# Patient Record
Sex: Male | Born: 1995 | Race: White | Hispanic: No | Marital: Single | Smoking: Never smoker
Health system: Southern US, Community
[De-identification: ages and names within clinical notes are randomized; demographics above are authoritative.]

## PROBLEM LIST (undated history)

## (undated) HISTORY — PX: MOUTH SURGERY: SHX715

---
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
## 2020-11-12 ENCOUNTER — Emergency Department (INDEPENDENT_AMBULATORY_CARE_PROVIDER_SITE_OTHER)
Admission: EM | Admit: 2020-11-12 | Discharge: 2020-11-12 | Disposition: A | Discharge status: Home / Self Care | Payer: 59 | Source: Home / Self Care | Attending: Family Medicine | Admitting: Family Medicine

## 2020-11-12 ENCOUNTER — Other Ambulatory Visit: Payer: Self-pay

## 2020-11-12 DIAGNOSIS — J069 Acute upper respiratory infection, unspecified: Secondary | ICD-10-CM

## 2020-11-12 MED ORDER — PREDNISONE 20 MG PO TABS: 20.0000 mg | ORAL_TABLET | Freq: Two times a day (BID) | ORAL | 0 refills | Status: DC

## 2020-11-12 NOTE — Discharge Instructions (Signed)
Take prednisone 2 times a day for 5 days Drink lots of fluid Over-the-counter cough and cold medicine

## 2020-11-12 NOTE — ED Triage Notes (Signed)
Pt presents with bilateral otalgia, headache, and cough x2-3days

## 2020-11-12 NOTE — ED Provider Notes (Signed)
[facility]    CSN: [id number] Arrival date & time: 11/12/20  1633      History   Chief Complaint Chief Complaint  Patient presents with   Otalgia   Cough   Headache    HPI AODHAN SCHEIDT is a 25 y.o. male.   HPI 3 to 4 days of illness with cough, ear pain, sinus pressure and pain, postnasal drip.  He states that he had some wheezing with his cough this morning.  No shortness of breath.  No sputum production.  No fever chills or body aches.  No known exposure to COVID or influenza.  No sore throat  History reviewed. No pertinent past medical history.  Patient Active Problem List   Diagnosis Date Noted   ADHD (attention deficit hyperactivity disorder) 02/26/2011    Past Surgical History:  Procedure Laterality Date   MOUTH SURGERY         Home Medications    Prior to Admission medications   Medication Sig Start Date End Date Taking? Authorizing Provider  predniSONE (DELTASONE) 20 MG tablet Take 1 tablet (20 mg total) by mouth 2 (two) times daily with a meal. 11/12/20  Yes Eustace Moore, MD    Family History History reviewed. No pertinent family history.  Social History Social History   Tobacco Use   Smoking status: Never   Smokeless tobacco: Never  Substance Use Topics   Alcohol use: Yes   Drug use: Never     Allergies   Promethazine hcl   Review of Systems Review of Systems See HPI  Physical Exam Triage Vital Signs ED Triage Vitals  Enc Vitals Group     BP 11/12/20 1721 126/84     Pulse Rate 11/12/20 1721 73     Resp 11/12/20 1721 14     Temp 11/12/20 1721 98.4 F (36.9 C)     Temp Source 11/12/20 1721 Oral     SpO2 11/12/20 1721 100 %     Weight --      Height --      Head Circumference --      Peak Flow --      Pain Score 11/12/20 1723 4     Pain Loc --      Pain Edu? --      Excl. in GC? --    No data found.  Updated Vital Signs BP 126/84 (BP Location: Left Arm)   Pulse 73   Temp 98.4 F (36.9 C)  (Oral)   Resp 14   SpO2 100%      Physical Exam Constitutional:      General: He is not in acute distress.    Appearance: Normal appearance. He is well-developed and normal weight.  HENT:     Head: Normocephalic and atraumatic.     Right Ear: Tympanic membrane, ear canal and external ear normal.     Left Ear: Tympanic membrane, ear canal and external ear normal.     Nose: Congestion and rhinorrhea present.     Comments: Clear    Mouth/Throat:     Pharynx: Posterior oropharyngeal erythema present.     Comments: Slight Eyes:     Conjunctiva/sclera: Conjunctivae normal.     Pupils: Pupils are equal, round, and reactive to light.  Cardiovascular:     Rate and Rhythm: Normal rate and regular rhythm.  Pulmonary:     Effort: Pulmonary effort is normal. No respiratory distress.     Breath sounds: Normal breath sounds.  No wheezing.  Abdominal:     General: There is no distension.     Palpations: Abdomen is soft.  Musculoskeletal:        General: Normal range of motion.     Cervical back: Normal range of motion.  Lymphadenopathy:     Cervical: Cervical adenopathy present.  Skin:    General: Skin is warm and dry.  Neurological:     Mental Status: He is alert.     UC Treatments / Results  Labs (all labs ordered are listed, but only abnormal results are displayed) Labs Reviewed - No data to display  EKG   Radiology No results found.  Procedures Procedures (including critical care time)  Medications Ordered in UC Medications - No data to display  Initial Impression / Assessment and Plan / UC Course  I have reviewed the triage vital signs and the nursing notes.  Pertinent labs & imaging results that were available during my care of the patient were reviewed by me and considered in my medical decision making (see chart for details).     Final Clinical Impressions(s) / UC Diagnoses   Final diagnoses:  Viral URI with cough     Discharge Instructions      Take  prednisone 2 times a day for 5 days Drink lots of fluid Over-the-counter cough and cold medicine      ED Prescriptions     Medication Sig Dispense Auth. Provider   predniSONE (DELTASONE) 20 MG tablet Take 1 tablet (20 mg total) by mouth 2 (two) times daily with a meal. 10 tablet Eustace Moore, MD      PDMP not reviewed this encounter.   Eustace Moore, MD 11/12/20 1750

## 2021-03-19 ENCOUNTER — Other Ambulatory Visit: Payer: Self-pay

## 2021-03-19 ENCOUNTER — Ambulatory Visit
Admission: EM | Admit: 2021-03-19 | Discharge: 2021-03-19 | Disposition: A | Discharge status: Home / Self Care | Payer: 59 | Attending: Emergency Medicine | Admitting: Emergency Medicine

## 2021-03-19 ENCOUNTER — Encounter: Payer: Self-pay | Admitting: Emergency Medicine

## 2021-03-19 DIAGNOSIS — L237 Allergic contact dermatitis due to plants, except food: Secondary | ICD-10-CM

## 2021-03-19 MED ORDER — METHYLPREDNISOLONE SODIUM SUCC 125 MG IJ SOLR
80.0000 mg | Freq: Once | INTRAMUSCULAR | Status: AC
  Administered 2021-03-19: 80 mg via INTRAMUSCULAR

## 2021-03-19 MED ORDER — PREDNISONE 10 MG (21) PO TBPK: ORAL_TABLET | Freq: Every day | ORAL | 0 refills | Status: DC

## 2021-03-19 NOTE — ED Triage Notes (Signed)
Pt presents with poison oak on his right arm x 2 days  ?

## 2021-03-19 NOTE — ED Provider Notes (Signed)
?[facility] ? ? ? ?CSN: [id number] ?Arrival date & time: 03/19/21  1623 ? ? ?  ? ?History   ?Chief Complaint ?Chief Complaint  ?Patient presents with  ? Poison Oak  ? ? ?HPI ?RATHANA VIVEROS is a 26 y.o. male.  Patient presents with poison oak rash on his right arm x2 days.  The rash is pruritic and spreading.  It started after he was helping his brother with yard work, removing vines.  No treatment attempted at home.  He states he has history of similar rash when he comes in contact with poison oak and requires a steroid shot.  No difficulty breathing.  No other rash. ? ?The history is provided by the patient.  ? ?History reviewed. No pertinent past medical history. ? ?Patient Active Problem List  ? Diagnosis Date Noted  ? ADHD (attention deficit hyperactivity disorder) 02/26/2011  ? ? ?Past Surgical History:  ?Procedure Laterality Date  ? MOUTH SURGERY    ? ? ? ? ? ?Home Medications   ? ?Prior to Admission medications   ?Medication Sig Start Date End Date Taking? Authorizing Provider  ?predniSONE (STERAPRED UNI-PAK 21 TAB) 10 MG (21) TBPK tablet Take by mouth daily. As directed 03/20/21  Yes Mickie Bail, NP  ? ? ?Family History ?No family history on file. ? ?Social History ?Social History  ? ?Tobacco Use  ? Smoking status: Never  ? Smokeless tobacco: Never  ?Vaping Use  ? Vaping Use: Never used  ?Substance Use Topics  ? Alcohol use: Yes  ? Drug use: Never  ? ? ? ?Allergies   ?Promethazine hcl ? ? ?Review of Systems ?Review of Systems  ?Constitutional:  Negative for chills and fever.  ?Respiratory:  Negative for cough and shortness of breath.   ?Skin:  Positive for rash. Negative for color change.  ?All other systems reviewed and are negative. ? ? ?Physical Exam ?Triage Vital Signs ?ED Triage Vitals  ?Enc Vitals Group  ?   BP   ?   Pulse   ?   Resp   ?   Temp   ?   Temp src   ?   SpO2   ?   Weight   ?   Height   ?   Head Circumference   ?   Peak Flow   ?   Pain Score   ?   Pain Loc   ?   Pain Edu?    ?   Excl. in GC?   ? ?No data found. ? ?Updated Vital Signs ?BP 123/78   Pulse 87   Temp 98.1 ?F (36.7 ?C)   Resp 18   SpO2 98%  ? ?Visual Acuity ?Right Eye Distance:   ?Left Eye Distance:   ?Bilateral Distance:   ? ?Right Eye Near:   ?Left Eye Near:    ?Bilateral Near:    ? ?Physical Exam ?Vitals and nursing note reviewed.  ?Constitutional:   ?   General: He is not in acute distress. ?   Appearance: He is well-developed.  ?HENT:  ?   Mouth/Throat:  ?   Mouth: Mucous membranes are moist.  ?Cardiovascular:  ?   Rate and Rhythm: Normal rate and regular rhythm.  ?   Heart sounds: Normal heart sounds.  ?Pulmonary:  ?   Effort: Pulmonary effort is normal. No respiratory distress.  ?   Breath sounds: Normal breath sounds.  ?Musculoskeletal:  ?   Cervical back: Neck supple.  ?Skin: ?  General: Skin is warm and dry.  ?   Findings: Rash present.  ?   Comments: Papular and vesicular clustered rash on right forearm and wrist.   ?Neurological:  ?   Mental Status: He is alert.  ?Psychiatric:     ?   Mood and Affect: Mood normal.     ?   Behavior: Behavior normal.  ? ? ? ?UC Treatments / Results  ?Labs ?(all labs ordered are listed, but only abnormal results are displayed) ?Labs Reviewed - No data to display ? ?EKG ? ? ?Radiology ?No results found. ? ?Procedures ?Procedures (including critical care time) ? ?Medications Ordered in UC ?Medications  ?methylPREDNISolone sodium succinate (SOLU-MEDROL) 125 mg/2 mL injection 80 mg (has no administration in time range)  ? ? ?Initial Impression / Assessment and Plan / UC Course  ?I have reviewed the triage vital signs and the nursing notes. ? ?Pertinent labs & imaging results that were available during my care of the patient were reviewed by me and considered in my medical decision making (see chart for details). ? ? Poison Oak dermatitis.  Solu-Medrol given here. Starting prednisone taper tomorrow.  Also treating with Benadryl; precautions for drowsiness discussed.  Instructed  patient to follow up with his PCP if his symptoms are not improving.  He agrees to plan of care.  ? ? ? ?Final Clinical Impressions(s) / UC Diagnoses  ? ?Final diagnoses:  ?Poison oak dermatitis  ? ? ? ?Discharge Instructions   ? ?  ?You were given an injection of a steroid called Solu-Medrol.  Start the prednisone taper tomorrow as directed.   ? ?Take Benadryl every 6 hours as directed; do not drive, operate machinery, or drink alcohol with this medication as it may cause drowsiness.  If you need to be awake and alert, take Claritin as directed.   ? ?Follow up with your primary care provider if your symptoms are not improving.   ? ? ? ? ? ?ED Prescriptions   ? ? Medication Sig Dispense Auth. Provider  ? predniSONE (STERAPRED UNI-PAK 21 TAB) 10 MG (21) TBPK tablet Take by mouth daily. As directed 21 tablet Mickie Bail, NP  ? ?  ? ?PDMP not reviewed this encounter. ?  ?Mickie Bail, NP ?03/19/21 1658 ? ?

## 2021-03-19 NOTE — Discharge Instructions (Addendum)
You were given an injection of a steroid called Solu-Medrol.  Start the prednisone taper tomorrow as directed.    Take Benadryl every 6 hours as directed; do not drive, operate machinery, or drink alcohol with this medication as it may cause drowsiness.  If you need to be awake and alert, take Claritin as directed.    Follow up with your primary care provider if your symptoms are not improving.       

## 2021-08-01 ENCOUNTER — Other Ambulatory Visit: Payer: Self-pay

## 2021-08-01 DIAGNOSIS — W540XXA Bitten by dog, initial encounter: Secondary | ICD-10-CM | POA: Diagnosis not present

## 2021-08-01 DIAGNOSIS — S0121XA Laceration without foreign body of nose, initial encounter: Secondary | ICD-10-CM | POA: Insufficient documentation

## 2021-08-01 DIAGNOSIS — Z23 Encounter for immunization: Secondary | ICD-10-CM | POA: Diagnosis not present

## 2021-08-01 DIAGNOSIS — S0992XA Unspecified injury of nose, initial encounter: Secondary | ICD-10-CM | POA: Diagnosis present

## 2021-08-01 MED ORDER — TETANUS-DIPHTH-ACELL PERTUSSIS 5-2.5-18.5 LF-MCG/0.5 IM SUSY
0.5000 mL | PREFILLED_SYRINGE | Freq: Once | INTRAMUSCULAR | Status: AC
  Administered 2021-08-01: 0.5 mL via INTRAMUSCULAR
  Filled 2021-08-01: qty 0.5

## 2021-08-01 MED ORDER — LIDOCAINE-EPINEPHRINE-TETRACAINE (LET) TOPICAL GEL
3.0000 mL | Freq: Once | TOPICAL | Status: AC
  Administered 2021-08-01: 3 mL via TOPICAL
  Filled 2021-08-01: qty 3

## 2021-08-01 MED ORDER — IBUPROFEN 800 MG PO TABS
800.0000 mg | ORAL_TABLET | Freq: Once | ORAL | Status: AC
  Administered 2021-08-01: 800 mg via ORAL
  Filled 2021-08-01: qty 1

## 2021-08-01 NOTE — ED Triage Notes (Signed)
Pt was playing with dog and it accidentally bit face. Lacs noted to bridge of nose and right cheek. Animal is owned by pt and all immunizations are up to date. No active bleeding noted at this time.

## 2021-08-01 NOTE — ED Provider Triage Note (Signed)
Emergency Medicine Provider Triage Evaluation Note  ERMAN THUM , a 26 y.o. male  was evaluated in triage.  Pt complains of dog bite.  Patient was playing with his dog who recently underwent a leg surgery and was bit in the face.  Patient has several abrasions to the face, small laceration to the bridge of the nose and right cheek.  Tetanus status unknown, dog's vaccinations are up-to-date  Review of Systems  Positive: Dog bite, abrasion, laceration Negative:   Physical Exam  BP (!) 147/80 (BP Location: Right Arm)   Pulse 89   Temp 98.4 F (36.9 C) (Oral)   Resp 18   Ht 5\' 10"  (1.778 m)   Wt 59 kg   SpO2 100%   BMI 18.65 kg/m  Gen:   Awake, no distress   Resp:  Normal effort  MSK:   Moves extremities without difficulty  Other:    Medical Decision Making  Medically screening exam initiated at 11:18 PM.  Appropriate orders placed.  TREXTON ESCAMILLA was informed that the remainder of the evaluation will be completed by another provider, this initial triage assessment does not replace that evaluation, and the importance of remaining in the ED until their evaluation is complete.     [name], Evon Slack 08/01/21 2320

## 2021-08-02 ENCOUNTER — Emergency Department
Admission: EM | Admit: 2021-08-02 | Discharge: 2021-08-02 | Disposition: A | Discharge status: Home / Self Care | Payer: 59 | Attending: Emergency Medicine | Admitting: Emergency Medicine

## 2021-08-02 DIAGNOSIS — S0185XA Open bite of other part of head, initial encounter: Secondary | ICD-10-CM

## 2021-08-02 MED ORDER — LIDOCAINE HCL (PF) 1 % IJ SOLN
5.0000 mL | Freq: Once | INTRAMUSCULAR | Status: AC
  Administered 2021-08-02: 5 mL via INTRADERMAL
  Filled 2021-08-02: qty 5

## 2021-08-02 MED ORDER — DOXYCYCLINE HYCLATE 100 MG PO CAPS: 100.0000 mg | ORAL_CAPSULE | Freq: Two times a day (BID) | ORAL | 0 refills | Status: AC

## 2021-08-02 MED ORDER — METRONIDAZOLE 500 MG PO TABS: 500.0000 mg | ORAL_TABLET | Freq: Three times a day (TID) | ORAL | 0 refills | Status: DC

## 2021-08-02 MED ORDER — AMOXICILLIN-POT CLAVULANATE 875-125 MG PO TABS: 1.0000 | ORAL_TABLET | Freq: Two times a day (BID) | ORAL | 0 refills | Status: DC

## 2021-08-02 NOTE — ED Provider Notes (Addendum)
[facility] Provider Note    Event Date/Time   First MD Initiated Contact with Patient 08/02/21 [phone]     (approximate)   History   Chief Complaint: Animal Bite   HPI  Jonathan Ramos is a 26 y.o. male no significant past medical history who was playing with his dog tonight when he accidentally bit his face.  Has pain of the anterior nose.  Reports that the dog's been behaving normally, not aggressive, immunizations up-to-date.  No eye pain, no mouth or dental pain.     Physical Exam   Triage Vital Signs: ED Triage Vitals  Enc Vitals Group     BP 08/01/21 2313 (!) 147/80     Pulse Rate 08/01/21 2313 89     Resp 08/01/21 2313 18     Temp 08/01/21 2313 98.4 F (36.9 C)     Temp Source 08/01/21 2313 Oral     SpO2 08/01/21 2313 100 %     Weight 08/01/21 2314 130 lb (59 kg)     Height 08/01/21 2314 5\' 10"  (1.778 m)     Head Circumference --      Peak Flow --      Pain Score 08/01/21 2314 5     Pain Loc --      Pain Edu? --      Excl. in GC? --     Most recent vital signs: Vitals:   08/01/21 2313  BP: (!) 147/80  Pulse: 89  Resp: 18  Temp: 98.4 F (36.9 C)  SpO2: 100%    General: Awake, no distress.  CV:  Good peripheral perfusion.  Resp:  Normal effort.  Abd:  No distention.  Other:  2 cm curvilinear and stellate laceration over the anterior distal nose with some skin avulsion.  No puncture through to the internal surface of the naris. There is a 1 cm linear scratch over the right maxilla.  Dermis is intact.  No eye injuries.  No septal hematoma in the nose.  No epistaxis.  No bony tenderness.   ED Results / Procedures / Treatments   Labs (all labs ordered are listed, but only abnormal results are displayed) Labs Reviewed - No data to display   EKG    RADIOLOGY    PROCEDURES:  .07/25/23Laceration Repair  Date/Time: 08/02/2021 4:42 AM  Performed by: 08/04/2021, MD Authorized by: Sharman Cheek, MD    Consent:    Consent obtained:  Verbal   Consent given by:  Patient   Risks discussed:  Infection, pain, retained foreign body, poor cosmetic result and poor wound healing Anesthesia:    Anesthesia method:  Local infiltration   Local anesthetic:  Lidocaine 1% w/o epi Laceration details:    Location:  Face   Face location:  Nose   Length (cm):  2 Exploration:    Hemostasis achieved with:  Direct pressure   Wound exploration: entire depth of wound visualized     Wound extent: no fascia violation noted, no foreign bodies/material noted, no muscle damage noted, no nerve damage noted, no tendon damage noted, no underlying fracture noted and no vascular damage noted     Contaminated: no   Treatment:    Area cleansed with:  Saline and povidone-iodine   Amount of cleaning:  Extensive   Irrigation solution:  Sterile saline   Visualized foreign bodies/material removed: no   Skin repair:    Repair method:  Sutures   Suture size:  4-0  Wound skin closure material used: monocryl.   Suture technique:  Simple interrupted   Number of sutures:  3 Approximation:    Approximation:  Close Repair type:    Repair type:  Simple Post-procedure details:    Dressing:  Sterile dressing   Procedure completion:  Tolerated well, no immediate complications    MEDICATIONS ORDERED IN ED: Medications  ibuprofen (ADVIL) tablet 800 mg (800 mg Oral Given 08/01/21 2323)  Tdap (BOOSTRIX) injection 0.5 mL (0.5 mLs Intramuscular Given 08/01/21 2322)  lidocaine-EPINEPHrine-tetracaine (LET) topical gel (3 mLs Topical Given 08/01/21 2323)  lidocaine (PF) (XYLOCAINE) 1 % injection 5 mL (5 mLs Intradermal Given by Other 08/02/21 0402)     IMPRESSION / MDM / ASSESSMENT AND PLAN / ED COURSE  I reviewed the triage vital signs and the nursing notes.                               Patient presents with dog bite laceration on the nose.  Area was cleaned.  Due to the cosmetically sensitive location, wound is repaired  primarily.  Will start Augmentin.  Rabies immunoglobulin or vaccination not required.  Tetanus updated today.       FINAL CLINICAL IMPRESSION(S) / ED DIAGNOSES   Final diagnoses:  Dog bite of face, initial encounter     Rx / DC Orders   ED Discharge Orders          Ordered    amoxicillin-clavulanate (AUGMENTIN) 875-125 MG tablet  2 times daily        08/02/21 0437             Note:  This document was prepared using Dragon voice recognition software and may include unintentional dictation errors.   Sharman Cheek, MD 08/02/21 5056    Sharman Cheek, MD 08/02/21 [phone]

## 2021-08-02 NOTE — ED Notes (Signed)
Pt called back and informed staff that he had learned that he is actually allergic to Augmentin after arriving home (this was prescribed for this pt at discharge). MD notified and Augmentin added to allergy list. Pt called back and informed of new orders for alternative abx sent to pharmacy.

## 2021-08-02 NOTE — ED Notes (Signed)
Patient verbalizes understanding of discharge instructions. Opportunity for questioning and answers were provided. Armband removed by staff, pt discharged from ED to home via POV  

## 2021-09-14 ENCOUNTER — Other Ambulatory Visit: Payer: Self-pay | Admitting: Family Medicine

## 2021-09-14 ENCOUNTER — Ambulatory Visit: Payer: 59

## 2021-09-14 DIAGNOSIS — Z0000 Encounter for general adult medical examination without abnormal findings: Secondary | ICD-10-CM

## 2021-10-02 ENCOUNTER — Ambulatory Visit
Admission: EM | Admit: 2021-10-02 | Discharge: 2021-10-02 | Disposition: A | Discharge status: Home / Self Care | Payer: 59 | Attending: Emergency Medicine | Admitting: Emergency Medicine

## 2021-10-02 DIAGNOSIS — R21 Rash and other nonspecific skin eruption: Secondary | ICD-10-CM | POA: Diagnosis not present

## 2021-10-02 DIAGNOSIS — W57XXXA Bitten or stung by nonvenomous insect and other nonvenomous arthropods, initial encounter: Secondary | ICD-10-CM | POA: Diagnosis not present

## 2021-10-02 DIAGNOSIS — S80861A Insect bite (nonvenomous), right lower leg, initial encounter: Secondary | ICD-10-CM | POA: Diagnosis not present

## 2021-10-02 MED ORDER — DOXYCYCLINE HYCLATE 100 MG PO CAPS: 200.0000 mg | ORAL_CAPSULE | Freq: Once | ORAL | 0 refills | Status: AC

## 2021-10-02 NOTE — ED Provider Notes (Signed)
[facility]    CSN: [id number] Arrival date & time: 10/02/21  1234      History   Chief Complaint Chief Complaint  Patient presents with   Insect Bite    HPI Jonathan Ramos is a 26 y.o. male.  Patient presents with a tick bite on his right lower leg which was removed yesterday.  The area has developed redness and a whitehead.  He is concerned because he previously had [facility] spotted fever.  He denies wound drainage, fever, chills, other rash, or other symptoms.   The history is provided by the patient and medical records.    No past medical history on file.  Patient Active Problem List   Diagnosis Date Noted   ADHD (attention deficit hyperactivity disorder) 02/26/2011    Past Surgical History:  Procedure Laterality Date   MOUTH SURGERY         Home Medications    Prior to Admission medications   Medication Sig Start Date End Date Taking? Authorizing Provider  doxycycline (VIBRAMYCIN) 100 MG capsule Take 2 capsules (200 mg total) by mouth once for 1 dose. 10/02/21 10/02/21 Yes Sharion Balloon, NP    Family History No family history on file.  Social History Social History   Tobacco Use   Smoking status: Never   Smokeless tobacco: Never  Vaping Use   Vaping Use: Never used  Substance Use Topics   Alcohol use: Yes   Drug use: Never     Allergies   Augmentin [amoxicillin-pot clavulanate] and Promethazine hcl   Review of Systems Review of Systems  Constitutional:  Negative for chills and fever.  Gastrointestinal:  Negative for abdominal pain, nausea and vomiting.  Musculoskeletal:  Negative for arthralgias and joint swelling.  Skin:  Positive for color change, rash and wound.  All other systems reviewed and are negative.    Physical Exam Triage Vital Signs ED Triage Vitals [10/02/21 1322]  Enc Vitals Group     BP 122/86     Pulse Rate 96     Resp 18     Temp 98.9 F (37.2 C)     Temp Source Oral     SpO2 98 %     Weight  125 lb (56.7 kg)     Height 5\' 10"  (1.778 m)     Head Circumference      Peak Flow      Pain Score 0     Pain Loc      Pain Edu?      Excl. in [place]?    No data found.  Updated Vital Signs BP 122/86   Pulse 96   Temp 98.9 F (37.2 C) (Oral)   Resp 18   Ht 5\' 10"  (1.778 m)   Wt 125 lb (56.7 kg)   SpO2 98%   BMI 17.94 kg/m   Visual Acuity Right Eye Distance:   Left Eye Distance:   Bilateral Distance:    Right Eye Near:   Left Eye Near:    Bilateral Near:     Physical Exam Vitals and nursing note reviewed.  Constitutional:      General: He is not in acute distress.    Appearance: Normal appearance. He is well-developed. He is not ill-appearing.  HENT:     Mouth/Throat:     Mouth: Mucous membranes are moist.  Cardiovascular:     Rate and Rhythm: Normal rate and regular rhythm.     Heart sounds: Normal heart sounds.  Pulmonary:     Effort: Pulmonary effort is normal. No respiratory distress.     Breath sounds: Normal breath sounds.  Musculoskeletal:        General: No swelling or deformity. Normal range of motion.     Cervical back: Neck supple.  Skin:    General: Skin is warm and dry.     Capillary Refill: Capillary refill takes less than 2 seconds.     Findings: Erythema and lesion present.     Comments: Small lesion on RLE with localized erythema; no drainage.  See picture.   Neurological:     Mental Status: He is alert.  Psychiatric:        Mood and Affect: Mood normal.        Behavior: Behavior normal.       UC Treatments / Results  Labs (all labs ordered are listed, but only abnormal results are displayed) Labs Reviewed  LYME DISEASE SEROLOGY W/REFLEX  ROCKY MTN SPOTTED FVR ABS PNL(IGG+IGM)  EHRLICHIA ANTIBODY PANEL    EKG   Radiology No results found.  Procedures Procedures (including critical care time)  Medications Ordered in UC Medications - No data to display  Initial Impression / Assessment and Plan / UC Course  I have reviewed  the triage vital signs and the nursing notes.  Pertinent labs & imaging results that were available during my care of the patient were reviewed by me and considered in my medical decision making (see chart for details).   Rash, tick bite on right lower leg.  Patient is concerned because he previously had [facility] spotted fever.  He requests lab work and treatment for tick-related illnesses.  Blood work for RMSF, Lyme, Ehrlichia pending.  Treating with one-time dose of doxycycline 200 mg.  Education provided on tick-related illness.  Instructed patient to follow-up with his PCP on Monday.  He agrees to plan of care.   Final Clinical Impressions(s) / UC Diagnoses   Final diagnoses:  Rash  Tick bite of right lower leg, initial encounter     Discharge Instructions      Take the doxycycline as directed.  Your tick bite labs are pending.  Follow up with your primary care provider on Monday.        ED Prescriptions     Medication Sig Dispense Auth. Provider   doxycycline (VIBRAMYCIN) 100 MG capsule Take 2 capsules (200 mg total) by mouth once for 1 dose. 2 capsule Mickie Bail, NP      PDMP not reviewed this encounter.   Mickie Bail, NP 10/02/21 1345

## 2021-10-02 NOTE — Discharge Instructions (Addendum)
Take the doxycycline as directed.  Your tick bite labs are pending.  Follow up with your primary care provider on Monday.

## 2021-10-02 NOTE — ED Triage Notes (Signed)
Patient to Urgent Care with complaints of tick bite present to right ankle, patient removed tick believes it was removed completely. White head present, reports area is itchy.

## 2021-10-07 LAB — SPECIMEN STATUS REPORT

## 2021-10-07 LAB — EHRLICHIA ANTIBODY PANEL
E. Chaffeensis (HME) IgM Titer: NEGATIVE
E.Chaffeensis (HME) IgG: NEGATIVE
HGE IgG Titer: NEGATIVE
HGE IgM Titer: NEGATIVE

## 2021-10-07 LAB — ROCKY MTN SPOTTED FVR ABS PNL(IGG+IGM)
RMSF IgG: NEGATIVE
RMSF IgM: 0.3 index (ref 0.00–0.89)

## 2021-10-07 LAB — LYME DISEASE SEROLOGY W/REFLEX: Lyme Total Antibody EIA: NEGATIVE

## 2021-11-06 ENCOUNTER — Ambulatory Visit
Admission: EM | Admit: 2021-11-06 | Discharge: 2021-11-06 | Disposition: A | Discharge status: Home / Self Care | Payer: 59 | Attending: Internal Medicine | Admitting: Internal Medicine

## 2021-11-06 DIAGNOSIS — H6593 Unspecified nonsuppurative otitis media, bilateral: Secondary | ICD-10-CM | POA: Diagnosis not present

## 2021-11-06 DIAGNOSIS — J029 Acute pharyngitis, unspecified: Secondary | ICD-10-CM | POA: Diagnosis present

## 2021-11-06 DIAGNOSIS — J069 Acute upper respiratory infection, unspecified: Secondary | ICD-10-CM | POA: Insufficient documentation

## 2021-11-06 LAB — POCT RAPID STREP A (OFFICE): Rapid Strep A Screen: NEGATIVE

## 2021-11-06 MED ORDER — CETIRIZINE HCL 10 MG PO TABS: 10.0000 mg | ORAL_TABLET | Freq: Every day | ORAL | 0 refills | Status: AC

## 2021-11-06 MED ORDER — FLUTICASONE PROPIONATE 50 MCG/ACT NA SUSP: 1.0000 | Freq: Every day | NASAL | 0 refills | Status: DC

## 2021-11-06 NOTE — ED Provider Notes (Signed)
[facility] Jonathan Ramos    CSN: [id number] Arrival date & time: 11/06/21  1355      History   Chief Complaint Chief Complaint  Patient presents with   Otalgia   Sore Throat    HPI STEVESON Ramos is a 26 y.o. male.   Patient presents with bilateral ear pain and sore throat that started yesterday.  He reports some mild nasal congestion that only occurs upon awakening but denies any cough or fever.  He also denies any known sick contacts.  Denies chest pain, shortness of breath, nausea, vomiting, diarrhea, abdominal pain, generalized body aches, chills.  Patient has not taken any medications to alleviate symptoms.   Otalgia Sore Throat    History reviewed. No pertinent past medical history.  Patient Active Problem List   Diagnosis Date Noted   ADHD (attention deficit hyperactivity disorder) 02/26/2011    Past Surgical History:  Procedure Laterality Date   MOUTH SURGERY         Home Medications    Prior to Admission medications   Medication Sig Start Date End Date Taking? Authorizing Provider  cetirizine (ZYRTEC) 10 MG tablet Take 1 tablet (10 mg total) by mouth daily. 11/06/21  Yes Wetzel Meester, Hildred Alamin E, FNP  fluticasone (FLONASE) 50 MCG/ACT nasal spray Place 1 spray into both nostrils daily. 11/06/21  Yes Blair Lundeen, Michele Rockers, FNP  doxycycline (VIBRAMYCIN) 100 MG capsule SMARTSIG:2 Capsule(s) By Mouth Once 10/02/21   [provider]  minocycline (MINOCIN) 100 MG capsule Take by mouth.    [provider]    Family History History reviewed. No pertinent family history.  Social History Social History   Tobacco Use   Smoking status: Never   Smokeless tobacco: Never  Vaping Use   Vaping Use: Never used  Substance Use Topics   Alcohol use: Yes   Drug use: Never     Allergies   Augmentin [amoxicillin-pot clavulanate] and Promethazine hcl   Review of Systems Review of Systems Per HPI  Physical Exam Triage Vital Signs ED Triage Vitals  [11/06/21 1500]  Enc Vitals Group     BP 114/76     Pulse Rate 85     Resp 18     Temp 98.2 F (36.8 C)     Temp src      SpO2 98 %     Weight      Height      Head Circumference      Peak Flow      Pain Score 0     Pain Loc      Pain Edu?      Excl. in [place]?    No data found.  Updated Vital Signs BP 114/76   Pulse 85   Temp 98.2 F (36.8 C)   Resp 18   SpO2 98%   Visual Acuity Right Eye Distance:   Left Eye Distance:   Bilateral Distance:    Right Eye Near:   Left Eye Near:    Bilateral Near:     Physical Exam Constitutional:      General: He is not in acute distress.    Appearance: Normal appearance. He is not toxic-appearing or diaphoretic.  HENT:     Head: Normocephalic and atraumatic.     Right Ear: Ear canal normal. No drainage, swelling or tenderness. A middle ear effusion is present. There is no impacted cerumen. No foreign body. No mastoid tenderness. Tympanic membrane is not perforated, erythematous or bulging.  Left Ear: Ear canal normal. No drainage, swelling or tenderness. A middle ear effusion is present. There is no impacted cerumen. No foreign body. No mastoid tenderness. Tympanic membrane is not perforated, erythematous or bulging.     Nose: Congestion present.     Mouth/Throat:     Mouth: Mucous membranes are moist.     Pharynx: Posterior oropharyngeal erythema present. No pharyngeal swelling or oropharyngeal exudate.     Tonsils: No tonsillar exudate or tonsillar abscesses.  Eyes:     Extraocular Movements: Extraocular movements intact.     Conjunctiva/sclera: Conjunctivae normal.     Pupils: Pupils are equal, round, and reactive to light.  Cardiovascular:     Rate and Rhythm: Normal rate and regular rhythm.     Pulses: Normal pulses.     Heart sounds: Normal heart sounds.  Pulmonary:     Effort: Pulmonary effort is normal. No respiratory distress.     Breath sounds: Normal breath sounds. No wheezing.  Abdominal:     General: Abdomen  is flat. Bowel sounds are normal.     Palpations: Abdomen is soft.  Musculoskeletal:        General: Normal range of motion.     Cervical back: Normal range of motion.  Skin:    General: Skin is warm and dry.  Neurological:     General: No focal deficit present.     Mental Status: He is alert and oriented to person, place, and time. Mental status is at baseline.  Psychiatric:        Mood and Affect: Mood normal.        Behavior: Behavior normal.      UC Treatments / Results  Labs (all labs ordered are listed, but only abnormal results are displayed) Labs Reviewed  CULTURE, GROUP A STREP [facility])  POCT RAPID STREP A (OFFICE)    EKG   Radiology No results found.  Procedures Procedures (including critical care time)  Medications Ordered in UC Medications - No data to display  Initial Impression / Assessment and Plan / UC Course  I have reviewed the triage vital signs and the nursing notes.  Pertinent labs & imaging results that were available during my care of the patient were reviewed by me and considered in my medical decision making (see chart for details).     Patient presents with symptoms likely from a viral upper respiratory infection. Differential includes bacterial pneumonia, sinusitis, allergic rhinitis, covid 19, flu. Do not suspect underlying cardiopulmonary process. Symptoms seem unlikely related to ACS, CHF or COPD exacerbations, pneumonia, pneumothorax. Patient is nontoxic appearing and not in need of emergent medical intervention.  Strep was negative.  Throat culture is pending.  Patient declined viral testing.  Recommended symptom control with over the counter medications. Patient sent prescriptions to alleviate symptoms and fluid behind TM.  Return if symptoms fail to improve in 1-2 weeks or you develop shortness of breath, chest pain, severe headache. Patient states understanding and is agreeable.  Discharged with PCP followup.  Final Clinical  Impressions(s) / UC Diagnoses   Final diagnoses:  Viral upper respiratory infection  Fluid level behind tympanic membrane of both ears  Sore throat     Discharge Instructions      Your rapid strep is negative.  You have fluid behind your eardrums which is being treated with 2 medications that has been sent to the pharmacy for you.  Suspect viral illness is causing your symptoms with should run its course and self  resolve with symptomatic treatment as we discussed.  Please follow-up if any symptoms persist or worsen.    ED Prescriptions     Medication Sig Dispense Auth. Provider   cetirizine (ZYRTEC) 10 MG tablet Take 1 tablet (10 mg total) by mouth daily. 30 tablet [place], Cecilia E, [place]   fluticasone [facility]) 50 MCG/ACT nasal spray Place 1 spray into both nostrils daily. 16 g Teodora Medici, [place]      PDMP not reviewed this encounter.   Teodora Medici,  11/06/21 [phone]

## 2021-11-06 NOTE — ED Triage Notes (Signed)
Pt. Presents to UC w/ c/o ear pain and a sore throat that started yesterday. Pt. Expresses concern for Strep throat.

## 2021-11-06 NOTE — Discharge Instructions (Signed)
Your rapid strep is negative.  You have fluid behind your eardrums which is being treated with 2 medications that has been sent to the pharmacy for you.  Suspect viral illness is causing your symptoms with should run its course and self resolve with symptomatic treatment as we discussed.  Please follow-up if any symptoms persist or worsen.

## 2021-11-08 LAB — CULTURE, GROUP A STREP (THRC)

## 2022-01-09 ENCOUNTER — Telehealth: Payer: 59 | Admitting: Nurse Practitioner

## 2022-01-09 DIAGNOSIS — J069 Acute upper respiratory infection, unspecified: Secondary | ICD-10-CM | POA: Diagnosis not present

## 2022-01-09 MED ORDER — FLUTICASONE PROPIONATE 50 MCG/ACT NA SUSP: 1.0000 | Freq: Every day | NASAL | 0 refills | Status: DC

## 2022-01-09 MED ORDER — PREDNISONE 20 MG PO TABS: 20.0000 mg | ORAL_TABLET | Freq: Every day | ORAL | 0 refills | Status: AC

## 2022-01-09 NOTE — Patient Instructions (Signed)
  [name], thank you for joining Claiborne Rigg, NP for today's virtual visit.  While this provider is not your primary care provider (PCP), if your PCP is located in our provider database this encounter information will be shared with them immediately following your visit.   A [facility] MyChart account gives you access to today's visit and all your visits, tests, and labs performed at [facility] " click here if you don't have a [facility] MyChart account or go to [email or web address]  Consent: (Patient) Jonathan Ramos provided verbal consent for this virtual visit at the beginning of the encounter.  Current Medications:  Current Outpatient Medications:    predniSONE (DELTASONE) 20 MG tablet, Take 1 tablet (20 mg total) by mouth daily with breakfast for 5 days., Disp: 5 tablet, Rfl: 0   cetirizine (ZYRTEC) 10 MG tablet, Take 1 tablet (10 mg total) by mouth daily., Disp: 30 tablet, Rfl: 0   fluticasone (FLONASE) 50 MCG/ACT nasal spray, Place 1 spray into both nostrils daily., Disp: 16 g, Rfl: 0   minocycline (MINOCIN) 100 MG capsule, Take by mouth., Disp: , Rfl:    Medications ordered in this encounter:  Meds ordered this encounter  Medications   fluticasone (FLONASE) 50 MCG/ACT nasal spray    Sig: Place 1 spray into both nostrils daily.    Dispense:  16 g    Refill:  0    Order Specific Question:   Supervising Provider    Answer:   Merrilee Jansky [[id number]]   predniSONE (DELTASONE) 20 MG tablet    Sig: Take 1 tablet (20 mg total) by mouth daily with breakfast for 5 days.    Dispense:  5 tablet    Refill:  0    Order Specific Question:   Supervising Provider    Answer:   Merrilee Jansky [id number]     *If you need refills on other medications prior to your next appointment, please contact your pharmacy*  Follow-Up: Call back or seek an in-person evaluation if the symptoms worsen or if the condition fails to improve as anticipated.  [facility] [phone]  Other Instructions INSTRUCTIONS: use a humidifier for nasal congestion Drink plenty of fluids, rest and wash hands frequently to avoid the spread of infection Alternate tylenol and Motrin for relief of fever    If you have been instructed to have an in-person evaluation today at a local Urgent Care facility, please use the link below. It will take you to a list of all of our available [facility], including address, phone number and hours of operation. Please do not delay care.  [facility]  If you or a family member do not have a primary care provider, use the link below to schedule a visit and establish care. When you choose a [facility] primary care physician or advanced practice provider, you gain a long-term partner in health. Find a Primary Care Provider  Learn more about [facility]'s in-office and virtual care options: [facility] - Get Care Now

## 2022-01-09 NOTE — Progress Notes (Signed)
Virtual Visit Consent   Jonathan Ramos, you are scheduled for a virtual visit with a [facility] provider today. Just as with appointments in the office, your consent must be obtained to participate. Your consent will be active for this visit and any virtual visit you may have with one of our providers in the next 365 days. If you have a MyChart account, a copy of this consent can be sent to you electronically.  As this is a virtual visit, video technology does not allow for your provider to perform a traditional examination. This may limit your provider's ability to fully assess your condition. If your provider identifies any concerns that need to be evaluated in person or the need to arrange testing (such as labs, EKG, etc.), we will make arrangements to do so. Although advances in technology are sophisticated, we cannot ensure that it will always work on either your end or our end. If the connection with a video visit is poor, the visit may have to be switched to a telephone visit. With either a video or telephone visit, we are not always able to ensure that we have a secure connection.  By engaging in this virtual visit, you consent to the provision of healthcare and authorize for your insurance to be billed (if applicable) for the services provided during this visit. Depending on your insurance coverage, you may receive a charge related to this service.  I need to obtain your verbal consent now. Are you willing to proceed with your visit today? SALIM FORERO has provided verbal consent on 01/09/2022 for a virtual visit (video or telephone). Claiborne Rigg, NP  Date: 01/09/2022 2:13 PM  Virtual Visit via Video Note   I, Claiborne Rigg, connected with  Jonathan Ramos  ([id number], [birth year]) on 01/09/22 at  2:15 PM EST by a video-enabled telemedicine application and verified that I am speaking with the correct person using two identifiers.  Location: Patient: Virtual Visit  Location Patient: Home Provider: Virtual Visit Location Provider: Home Office   I discussed the limitations of evaluation and management by telemedicine and the availability of in person appointments. The patient expressed understanding and agreed to proceed.    History of Present Illness: Jonathan Ramos is a 26 y.o. who identifies as a male who was assigned male at birth, and is being seen today for viral URI with cough.   Mr. Omalley notes 4-day onset of cough, sinus pressure, sore throat and pain, postnasal drip and bilateral ear pain and pressure.  Denies fever, body aches, chest pain or shortness of breath.  No known exposure to COVID or influenza.   Problems:  Patient Active Problem List   Diagnosis Date Noted   ADHD (attention deficit hyperactivity disorder) 02/26/2011    Allergies:  Allergies  Allergen Reactions   Augmentin [Amoxicillin-Pot Clavulanate] Hives   Promethazine Hcl    Medications:  Current Outpatient Medications:    predniSONE (DELTASONE) 20 MG tablet, Take 1 tablet (20 mg total) by mouth daily with breakfast for 5 days., Disp: 5 tablet, Rfl: 0   cetirizine (ZYRTEC) 10 MG tablet, Take 1 tablet (10 mg total) by mouth daily., Disp: 30 tablet, Rfl: 0   fluticasone (FLONASE) 50 MCG/ACT nasal spray, Place 1 spray into both nostrils daily., Disp: 16 g, Rfl: 0   minocycline (MINOCIN) 100 MG capsule, Take by mouth., Disp: , Rfl:   Observations/Objective: Patient is well-developed, well-nourished in no acute distress.  Resting comfortably at home.  Head is normocephalic, atraumatic.  No labored breathing.  Speech is clear and coherent with logical content.  Patient is alert and oriented at baseline.    Assessment and Plan: 1. Viral URI with cough - fluticasone (FLONASE) 50 MCG/ACT nasal spray; Place 1 spray into both nostrils daily.  Dispense: 16 g; Refill: 0 - predniSONE (DELTASONE) 20 MG tablet; Take 1 tablet (20 mg total) by mouth daily with breakfast for  5 days.  Dispense: 5 tablet; Refill: 0  INSTRUCTIONS: use a humidifier for nasal congestion Drink plenty of fluids, rest and wash hands frequently to avoid the spread of infection Alternate tylenol and Motrin for relief of fever   Follow Up Instructions: I discussed the assessment and treatment plan with the patient. The patient was provided an opportunity to ask questions and all were answered. The patient agreed with the plan and demonstrated an understanding of the instructions.  A copy of instructions were sent to the patient via MyChart unless otherwise noted below.     The patient was advised to call back or seek an in-person evaluation if the symptoms worsen or if the condition fails to improve as anticipated.  Time:  I spent 12 minutes with the patient via telehealth technology discussing the above problems/concerns.    Claiborne Rigg, NP

## 2022-01-31 ENCOUNTER — Other Ambulatory Visit: Payer: Self-pay | Admitting: Nurse Practitioner

## 2022-01-31 DIAGNOSIS — J069 Acute upper respiratory infection, unspecified: Secondary | ICD-10-CM

## 2022-02-01 NOTE — Telephone Encounter (Signed)
Requested Prescriptions  Pending Prescriptions Disp Refills   fluticasone (FLONASE) 50 MCG/ACT nasal spray [Pharmacy Med Name: FLUTICASONE PROP 50 MCG SPRAY] 48 mL 1    Sig: SPRAY 1 SPRAY INTO BOTH NOSTRILS DAILY.     Ear, Nose, and Throat: Nasal Preparations - Corticosteroids Failed - 01/31/2022 10:32 AM      Failed - Valid encounter within last 12 months    Recent Outpatient Visits           10 years ago ADHD (attention deficit hyperactivity disorder)   [facility] at [facility], MD

## 2022-02-28 ENCOUNTER — Telehealth: Payer: 59 | Admitting: Physician Assistant

## 2022-02-28 DIAGNOSIS — J019 Acute sinusitis, unspecified: Secondary | ICD-10-CM

## 2022-02-28 MED ORDER — FLUTICASONE PROPIONATE 50 MCG/ACT NA SUSP: 2.0000 | Freq: Every day | NASAL | 6 refills | Status: AC

## 2022-02-28 MED ORDER — PREDNISONE 20 MG PO TABS: 40.0000 mg | ORAL_TABLET | Freq: Every day | ORAL | 0 refills | Status: DC

## 2022-02-28 NOTE — Progress Notes (Signed)
Patient also submitted video visit with [place]. Is currently on video. Visit will be completed via video. No charge for e-visit.

## 2022-02-28 NOTE — Progress Notes (Signed)
Virtual Visit Consent   Jonathan Ramos, you are scheduled for a virtual visit with a [facility] provider today. Just as with appointments in the office, your consent must be obtained to participate. Your consent will be active for this visit and any virtual visit you may have with one of our providers in the next 365 days. If you have a MyChart account, a copy of this consent can be sent to you electronically.  As this is a virtual visit, video technology does not allow for your provider to perform a traditional examination. This may limit your provider's ability to fully assess your condition. If your provider identifies any concerns that need to be evaluated in person or the need to arrange testing (such as labs, EKG, etc.), we will make arrangements to do so. Although advances in technology are sophisticated, we cannot ensure that it will always work on either your end or our end. If the connection with a video visit is poor, the visit may have to be switched to a telephone visit. With either a video or telephone visit, we are not always able to ensure that we have a secure connection.  By engaging in this virtual visit, you consent to the provision of healthcare and authorize for your insurance to be billed (if applicable) for the services provided during this visit. Depending on your insurance coverage, you may receive a charge related to this service.  I need to obtain your verbal consent now. Are you willing to proceed with your visit today? Jonathan Ramos has provided verbal consent on 02/28/2022 for a virtual visit (video or telephone). Lenise Arena Ward, PA-C  Date: 02/28/2022 6:29 PM  Virtual Visit via Video Note   I, Lenise Arena Ward, connected with  Jonathan Ramos  ([id number], 01/19/1995) on 02/28/22 at  6:30 PM EST by a video-enabled telemedicine application and verified that I am speaking with the correct person using two identifiers.  Location: Patient: Virtual Visit  Location Patient: Home Provider: Virtual Visit Location Provider: Home Office   I discussed the limitations of evaluation and management by telemedicine and the availability of in person appointments. The patient expressed understanding and agreed to proceed.    History of Present Illness: Jonathan Ramos is a 27 y.o. who identifies as a male who was assigned male at birth, and is being seen today for sore throat, ear pressure, postnasal drip that started about four days ago.  Reports associated headache.  He denies fever, chills, body aches, swollen lymph nodes.  He is taking nothing for the sx.    HPI: HPI  Problems:  Patient Active Problem List   Diagnosis Date Noted   ADHD (attention deficit hyperactivity disorder) 02/26/2011    Allergies:  Allergies  Allergen Reactions   Augmentin [Amoxicillin-Pot Clavulanate] Hives   Promethazine Hcl    Medications:  Current Outpatient Medications:    fluticasone (FLONASE) 50 MCG/ACT nasal spray, Place 2 sprays into both nostrils daily., Disp: 16 g, Rfl: 6   predniSONE (DELTASONE) 20 MG tablet, Take 2 tablets (40 mg total) by mouth daily with breakfast for 5 days., Disp: 10 tablet, Rfl: 0   cetirizine (ZYRTEC) 10 MG tablet, Take 1 tablet (10 mg total) by mouth daily., Disp: 30 tablet, Rfl: 0   fluticasone (FLONASE) 50 MCG/ACT nasal spray, Place 1 spray into both nostrils daily., Disp: 16 g, Rfl: 0   minocycline (MINOCIN) 100 MG capsule, Take by mouth., Disp: , Rfl:   Observations/Objective: Patient is  well-developed, well-nourished in no acute distress.  Resting comfortably  at home.  Head is normocephalic, atraumatic.  No labored breathing.  Speech is clear and coherent with logical content.  Patient is alert and oriented at baseline.    Assessment and Plan: 1. Acute non-recurrent sinusitis, unspecified location - fluticasone (FLONASE) 50 MCG/ACT nasal spray; Place 2 sprays into both nostrils daily.  Dispense: 16 g; Refill: 6 -  predniSONE (DELTASONE) 20 MG tablet; Take 2 tablets (40 mg total) by mouth daily with breakfast for 5 days.  Dispense: 10 tablet; Refill: 0  Sore throat likely from postnasal drip. Pt requests prednisone, reports prescribed before with similar sx and was very helpful.   Follow Up Instructions: I discussed the assessment and treatment plan with the patient. The patient was provided an opportunity to ask questions and all were answered. The patient agreed with the plan and demonstrated an understanding of the instructions.  A copy of instructions were sent to the patient via MyChart unless otherwise noted below.     The patient was advised to call back or seek an in-person evaluation if the symptoms worsen or if the condition fails to improve as anticipated.  Time:  I spent 8 minutes with the patient via telehealth technology discussing the above problems/concerns.    Lenise Arena Ward, PA-C

## 2022-02-28 NOTE — Patient Instructions (Signed)
  Jonathan Ramos, thank you for joining Lenise Arena Ward, PA-C for today's virtual visit.  While this provider is not your primary care provider (PCP), if your PCP is located in our provider database this encounter information will be shared with them immediately following your visit.   [facility] account gives you access to today's visit and all your visits, tests, and labs performed at [facility] " click here if you don't have a [facility] account or go to [email or web address]  Consent: (Patient) Jonathan Ramos provided verbal consent for this virtual visit at the beginning of the encounter.  Current Medications:  Current Outpatient Medications:    fluticasone (FLONASE) 50 MCG/ACT nasal spray, Place 2 sprays into both nostrils daily., Disp: 16 g, Rfl: 6   predniSONE (DELTASONE) 20 MG tablet, Take 2 tablets (40 mg total) by mouth daily with breakfast for 5 days., Disp: 10 tablet, Rfl: 0   cetirizine (ZYRTEC) 10 MG tablet, Take 1 tablet (10 mg total) by mouth daily., Disp: 30 tablet, Rfl: 0   fluticasone (FLONASE) 50 MCG/ACT nasal spray, Place 1 spray into both nostrils daily., Disp: 16 g, Rfl: 0   minocycline (MINOCIN) 100 MG capsule, Take by mouth., Disp: , Rfl:    Medications ordered in this encounter:  Meds ordered this encounter  Medications   fluticasone (FLONASE) 50 MCG/ACT nasal spray    Sig: Place 2 sprays into both nostrils daily.    Dispense:  16 g    Refill:  6    Order Specific Question:   Supervising Provider    Answer:   Chase Picket [id number]   predniSONE (DELTASONE) 20 MG tablet    Sig: Take 2 tablets (40 mg total) by mouth daily with breakfast for 5 days.    Dispense:  10 tablet    Refill:  0    Order Specific Question:   Supervising Provider    Answer:   Chase Picket [id number]     *If you need refills on other medications prior to your next appointment, please contact your pharmacy*  Follow-Up: Call back  or seek an in-person evaluation if the symptoms worsen or if the condition fails to improve as anticipated.  [facility] [phone]  Other Instructions Take prednisone as prescribed. Use flonase, mucinex, and drink plenty of fluids.  Can take Ibuprofen as needed for pain.  Follow up for in person visit if you develop new or worsening symptoms.    If you have been instructed to have an in-person evaluation today at a local Urgent Care facility, please use the link below. It will take you to a list of all of our available [facility], including address, phone number and hours of operation. Please do not delay care.  [facility]  If you or a family member do not have a primary care provider, use the link below to schedule a visit and establish care. When you choose a [facility] primary care physician or advanced practice provider, you gain a long-term partner in health. Find a Primary Care Provider  Learn more about 's in-office and virtual care options: [facility]

## 2022-03-03 ENCOUNTER — Telehealth: Payer: 59 | Admitting: Physician Assistant

## 2022-03-03 DIAGNOSIS — Z20818 Contact with and (suspected) exposure to other bacterial communicable diseases: Secondary | ICD-10-CM

## 2022-03-03 DIAGNOSIS — J0100 Acute maxillary sinusitis, unspecified: Secondary | ICD-10-CM | POA: Diagnosis not present

## 2022-03-03 DIAGNOSIS — J029 Acute pharyngitis, unspecified: Secondary | ICD-10-CM

## 2022-03-03 MED ORDER — AZITHROMYCIN 250 MG PO TABS: ORAL_TABLET | ORAL | 0 refills | Status: AC

## 2022-03-03 NOTE — Progress Notes (Signed)
Virtual Visit Consent   Jonathan Ramos, you are scheduled for a virtual visit with a [facility] provider today. Just as with appointments in the office, your consent must be obtained to participate. Your consent will be active for this visit and any virtual visit you may have with one of our providers in the next 365 days. If you have a MyChart account, a copy of this consent can be sent to you electronically.  As this is a virtual visit, video technology does not allow for your provider to perform a traditional examination. This may limit your provider's ability to fully assess your condition. If your provider identifies any concerns that need to be evaluated in person or the need to arrange testing (such as labs, EKG, etc.), we will make arrangements to do so. Although advances in technology are sophisticated, we cannot ensure that it will always work on either your end or our end. If the connection with a video visit is poor, the visit may have to be switched to a telephone visit. With either a video or telephone visit, we are not always able to ensure that we have a secure connection.  By engaging in this virtual visit, you consent to the provision of healthcare and authorize for your insurance to be billed (if applicable) for the services provided during this visit. Depending on your insurance coverage, you may receive a charge related to this service.  I need to obtain your verbal consent now. Are you willing to proceed with your visit today? Jonathan Ramos has provided verbal consent on 03/03/2022 for a virtual visit (video or telephone). Leeanne Rio, [place]  Date: 03/03/2022 6:10 PM  Virtual Visit via Video Note   I, Leeanne Rio, connected with  Jonathan Ramos  ([id number], December 29, 1995) on 03/03/22 at  6:15 PM EST by a video-enabled telemedicine application and verified that I am speaking with the correct person using two identifiers.  Location: Patient:  Virtual Visit Location Patient: Home Provider: Virtual Visit Location Provider: Home Office   I discussed the limitations of evaluation and management by telemedicine and the availability of in person appointments. The patient expressed understanding and agreed to proceed.    History of Present Illness: Jonathan Ramos is a 27 y.o. who identifies as a male who was assigned male at birth, and is being seen today for continued symptoms despite treatment from last visit. Notes continued sinus pressure, ear pressure, sore throat, now with maxillary/tooth pain. Notes low-grade fever over past couple of days. Currently at 100.6. Is taking his prednisone and using Flonase as directed. Now with cough that is productive in AM but other times clear. Step-daughter diagnosed with strep throat.    HPI: HPI  Problems:  Patient Active Problem List   Diagnosis Date Noted   ADHD (attention deficit hyperactivity disorder) 02/26/2011    Allergies:  Allergies  Allergen Reactions   Augmentin [Amoxicillin-Pot Clavulanate] Hives   Promethazine Hcl    Medications:  Current Outpatient Medications:    azithromycin (ZITHROMAX) 250 MG tablet, Take 2 tablets on day 1, then 1 tablet daily on days 2 through 5, Disp: 6 tablet, Rfl: 0   cetirizine (ZYRTEC) 10 MG tablet, Take 1 tablet (10 mg total) by mouth daily., Disp: 30 tablet, Rfl: 0   fluticasone (FLONASE) 50 MCG/ACT nasal spray, Place 2 sprays into both nostrils daily., Disp: 16 g, Rfl: 6   minocycline (MINOCIN) 100 MG capsule, Take by mouth., Disp: , Rfl:  Observations/Objective: Patient is well-developed, well-nourished in no acute distress.  Resting comfortably at home.  Head is normocephalic, atraumatic.  No labored breathing. Speech is clear and coherent with logical content.  Patient is alert and oriented at baseline.   Assessment and Plan: 1. Strep throat exposure - azithromycin (ZITHROMAX) 250 MG tablet; Take 2 tablets on day 1, then 1  tablet daily on days 2 through 5  Dispense: 6 tablet; Refill: 0  2. Sore throat - azithromycin (ZITHROMAX) 250 MG tablet; Take 2 tablets on day 1, then 1 tablet daily on days 2 through 5  Dispense: 6 tablet; Refill: 0  3. Acute non-recurrent maxillary sinusitis - azithromycin (ZITHROMAX) 250 MG tablet; Take 2 tablets on day 1, then 1 tablet daily on days 2 through 5  Dispense: 6 tablet; Refill: 0  Giving ongoing symptoms, known strep exposure and now with fever, will start antibiotic to cover sinuses and concern for strep. Is penicillin-allergic. Start Azithromycin per orders. Finish prednisone. Supportive measures and OTC medications reviewed. Follow-up in person if symptoms are not resolving.   Follow Up Instructions: I discussed the assessment and treatment plan with the patient. The patient was provided an opportunity to ask questions and all were answered. The patient agreed with the plan and demonstrated an understanding of the instructions.  A copy of instructions were sent to the patient via MyChart unless otherwise noted below.   The patient was advised to call back or seek an in-person evaluation if the symptoms worsen or if the condition fails to improve as anticipated.  Time:  I spent 10 minutes with the patient via telehealth technology discussing the above problems/concerns.    Leeanne Rio, PA-C

## 2022-03-03 NOTE — Patient Instructions (Signed)
  Jonathan Ramos, thank you for joining Leeanne Rio, PA-C for today's virtual visit.  While this provider is not your primary care provider (PCP), if your PCP is located in our provider database this encounter information will be shared with them immediately following your visit.   [facility] account gives you access to today's visit and all your visits, tests, and labs performed at [facility] " click here if you don't have a [facility] account or go to [email or web address]  Consent: (Patient) Jonathan Ramos provided verbal consent for this virtual visit at the beginning of the encounter.  Current Medications:  Current Outpatient Medications:    cetirizine (ZYRTEC) 10 MG tablet, Take 1 tablet (10 mg total) by mouth daily., Disp: 30 tablet, Rfl: 0   fluticasone (FLONASE) 50 MCG/ACT nasal spray, Place 1 spray into both nostrils daily., Disp: 16 g, Rfl: 0   fluticasone (FLONASE) 50 MCG/ACT nasal spray, Place 2 sprays into both nostrils daily., Disp: 16 g, Rfl: 6   minocycline (MINOCIN) 100 MG capsule, Take by mouth., Disp: , Rfl:    predniSONE (DELTASONE) 20 MG tablet, Take 2 tablets (40 mg total) by mouth daily with breakfast for 5 days., Disp: 10 tablet, Rfl: 0   Medications ordered in this encounter:  No orders of the defined types were placed in this encounter.    *If you need refills on other medications prior to your next appointment, please contact your pharmacy*  Follow-Up: Call back or seek an in-person evaluation if the symptoms worsen or if the condition fails to improve as anticipated.  [facility] [phone]  Other Instructions Please continue to stay well-hydrated and get plenty of rest.  Finish the prednisone. You can use Tylenol OTC as needed. Run a humidifier in the bedroom at night. Salt-water gargles and chloraseptic spray can also be beneficial. Take the antibiotic as directed. If symptoms  are not resolving or you note any new or worsening symptoms despite treatment, please seek an in-person evaluation.    If you have been instructed to have an in-person evaluation today at a local Urgent Care facility, please use the link below. It will take you to a list of all of our available [facility], including address, phone number and hours of operation. Please do not delay care.  [facility]  If you or a family member do not have a primary care provider, use the link below to schedule a visit and establish care. When you choose a [facility] primary care physician or advanced practice provider, you gain a long-term partner in health. Find a Primary Care Provider  Learn more about [facility]'s in-office and virtual care options: [facility]

## 2022-03-09 ENCOUNTER — Ambulatory Visit
Admission: RE | Admit: 2022-03-09 | Discharge: 2022-03-09 | Disposition: A | Discharge status: Home / Self Care | Payer: 59 | Source: Ambulatory Visit | Attending: Emergency Medicine | Admitting: Emergency Medicine

## 2022-03-09 VITALS — BP 126/81 | HR 86 | Temp 98.5°F | Resp 18

## 2022-03-09 DIAGNOSIS — J029 Acute pharyngitis, unspecified: Secondary | ICD-10-CM | POA: Diagnosis not present

## 2022-03-09 DIAGNOSIS — H9203 Otalgia, bilateral: Secondary | ICD-10-CM

## 2022-03-09 LAB — POCT RAPID STREP A (OFFICE): Rapid Strep A Screen: NEGATIVE

## 2022-03-09 NOTE — ED Provider Notes (Signed)
[facility]    CSN: [id number] Arrival date & time: 03/09/22  1725      History   Chief Complaint Chief Complaint  Patient presents with   Ear Drainage    Continuing to get worse even after antibiotics and steroids. - Entered by patient    HPI Jonathan Ramos is a 27 y.o. male.  Patient presents with ear pain, sore throat, mild cough x 1 week.  He denies fever, chills, rash, shortness of breath, chest pain, abdominal pain, vomiting, diarrhea, or other symptoms.  No OTC medications taken today.  Patient had a video visit on 02/28/2022; diagnosed with acute sinusitis; treated with prednisone.  He had a video visit on 03/03/2022; diagnosed with strep throat exposure; treated with Zithromax.  The history is provided by the patient and medical records.    History reviewed. No pertinent past medical history.  Patient Active Problem List   Diagnosis Date Noted   ADHD (attention deficit hyperactivity disorder) 02/26/2011    Past Surgical History:  Procedure Laterality Date   MOUTH SURGERY         Home Medications    Prior to Admission medications   Medication Sig Start Date End Date Taking? Authorizing Provider  cetirizine (ZYRTEC) 10 MG tablet Take 1 tablet (10 mg total) by mouth daily. 11/06/21   Teodora Medici, FNP  fluticasone (FLONASE) 50 MCG/ACT nasal spray Place 2 sprays into both nostrils daily. 02/28/22   Ward, Lenise Arena, PA-C  minocycline (MINOCIN) 100 MG capsule Take by mouth. Patient not taking: Reported on 03/09/2022    [provider]    Family History History reviewed. No pertinent family history.  Social History Social History   Tobacco Use   Smoking status: Never   Smokeless tobacco: Never  Vaping Use   Vaping Use: Never used  Substance Use Topics   Alcohol use: Yes   Drug use: Never     Allergies   Augmentin [amoxicillin-pot clavulanate] and Promethazine hcl   Review of Systems Review of Systems  Constitutional:   Negative for chills and fever.  HENT:  Positive for ear pain and sore throat.   Respiratory:  Positive for cough. Negative for shortness of breath.   Cardiovascular:  Negative for chest pain and palpitations.  Gastrointestinal:  Negative for diarrhea and vomiting.  Skin:  Negative for color change and rash.  All other systems reviewed and are negative.    Physical Exam Triage Vital Signs ED Triage Vitals [03/09/22 1737]  Enc Vitals Group     BP 126/81     Pulse Rate 86     Resp 18     Temp 98.5 F (36.9 C)     Temp src      SpO2 97 %     Weight      Height      Head Circumference      Peak Flow      Pain Score      Pain Loc      Pain Edu?      Excl. in [place]?    No data found.  Updated Vital Signs BP 126/81   Pulse 86   Temp 98.5 F (36.9 C)   Resp 18   SpO2 97%   Visual Acuity Right Eye Distance:   Left Eye Distance:   Bilateral Distance:    Right Eye Near:   Left Eye Near:    Bilateral Near:     Physical Exam Vitals and  nursing note reviewed.  Constitutional:      General: He is not in acute distress.    Appearance: Normal appearance. He is well-developed. He is not ill-appearing.  HENT:     Right Ear: Tympanic membrane normal.     Left Ear: Tympanic membrane normal.     Nose: Nose normal.     Mouth/Throat:     Mouth: Mucous membranes are moist.     Pharynx: Posterior oropharyngeal erythema present.  Cardiovascular:     Rate and Rhythm: Normal rate and regular rhythm.     Heart sounds: Normal heart sounds. No murmur heard. Pulmonary:     Effort: Pulmonary effort is normal. No respiratory distress.     Breath sounds: Normal breath sounds.  Musculoskeletal:     Cervical back: Neck supple.  Skin:    General: Skin is warm and dry.  Neurological:     Mental Status: He is alert.  Psychiatric:        Mood and Affect: Mood normal.        Behavior: Behavior normal.      UC Treatments / Results  Labs (all labs ordered are listed, but only  abnormal results are displayed) Labs Reviewed - No data to display  EKG   Radiology No results found.  Procedures Procedures (including critical care time)  Medications Ordered in UC Medications - No data to display  Initial Impression / Assessment and Plan / UC Course  I have reviewed the triage vital signs and the nursing notes.  Pertinent labs & imaging results that were available during my care of the patient were reviewed by me and considered in my medical decision making (see chart for details).     Viral pharyngitis, bilateral otalgia.  Afebrile, VSS.  Patient recently completed prednisone and then Zithromax.  Rapid strep negative.  Discussed symptomatic treatment including Tylenol, rest, hydration.  Instructed patient to follow up with his PCP if symptoms are not improving.  He agrees to plan of care.   Final Clinical Impressions(s) / UC Diagnoses   Final diagnoses:  Viral pharyngitis  Otalgia of both ears     Discharge Instructions      Take Tylenol as needed for fever or discomfort.  Rest and keep yourself hydrated.    Follow-up with your primary care provider if your symptoms are not improving.         ED Prescriptions   None    PDMP not reviewed this encounter.   Sharion Balloon, NP 03/09/22 [phone]

## 2022-03-09 NOTE — Discharge Instructions (Addendum)
Take Tylenol as needed for fever or discomfort.  Rest and keep yourself hydrated.    Follow-up with your primary care provider if your symptoms are not improving.

## 2022-03-09 NOTE — ED Triage Notes (Signed)
Patient to Urgent Care with complaints of  bilateral ear pain that started one week ago.   Reports completing antibiotic and steroids but pain has worsened. Reports pain radiates into his neck.

## 2024-02-03 ENCOUNTER — Telehealth: Admitting: Nurse Practitioner

## 2024-02-03 DIAGNOSIS — U071 COVID-19: Secondary | ICD-10-CM | POA: Diagnosis not present

## 2024-02-03 MED ORDER — MOLNUPIRAVIR EUA 200MG CAPSULE: 4.0000 | ORAL_CAPSULE | Freq: Two times a day (BID) | ORAL | 0 refills | Status: AC

## 2024-02-03 MED ORDER — IBUPROFEN 600 MG PO TABS: 600.0000 mg | ORAL_TABLET | Freq: Three times a day (TID) | ORAL | 0 refills | Status: AC | PRN

## 2024-02-03 MED ORDER — PSEUDOEPH-BROMPHEN-DM 30-2-10 MG/5ML PO SYRP: 5.0000 mL | ORAL_SOLUTION | Freq: Four times a day (QID) | ORAL | 0 refills | Status: AC | PRN

## 2024-02-03 MED ORDER — PREDNISONE 20 MG PO TABS: 20.0000 mg | ORAL_TABLET | Freq: Every day | ORAL | 0 refills | Status: AC

## 2024-02-03 NOTE — Patient Instructions (Signed)
 " Jonathan Ramos, thank you for joining Jonathan LELON Servant, Jonathan Ramos for today's virtual visit.  While this provider is not your primary care provider (PCP), if your PCP is located in our provider database this encounter information will be shared with them immediately following your visit.   A [facility] MyChart account gives you access to today's visit and all your visits, tests, and labs performed at [facility]  click here if you don't have a [facility] MyChart account or go to [email or web address]  Consent: (Patient) Jonathan Ramos provided verbal consent for this virtual visit at the beginning of the encounter.  Current Medications:  Current Outpatient Medications:    brompheniramine-pseudoephedrine-DM 30-2-10 MG/5ML syrup, Take 5 mLs by mouth 4 (four) times daily as needed., Disp: 240 mL, Rfl: 0   ibuprofen  (ADVIL ) 600 MG tablet, Take 1 tablet (600 mg total) by mouth every 8 (eight) hours as needed., Disp: 30 tablet, Rfl: 0   molnupiravir  EUA (LAGEVRIO ) 200 mg CAPS capsule, Take 4 capsules (800 mg total) by mouth 2 (two) times daily for 5 days., Disp: 40 capsule, Rfl: 0   cetirizine  (ZYRTEC ) 10 MG tablet, Take 1 tablet (10 mg total) by mouth daily., Disp: 30 tablet, Rfl: 0   fluticasone  (FLONASE ) 50 MCG/ACT nasal spray, Place 2 sprays into both nostrils daily., Disp: 16 g, Rfl: 6   minocycline (MINOCIN) 100 MG capsule, Take by mouth. (Patient not taking: Reported on 03/09/2022), Disp: , Rfl:    Medications ordered in this encounter:  Meds ordered this encounter  Medications   molnupiravir  EUA (LAGEVRIO ) 200 mg CAPS capsule    Sig: Take 4 capsules (800 mg total) by mouth 2 (two) times daily for 5 days.    Dispense:  40 capsule    Refill:  0    Supervising Provider:   LAMPTEY, PHILIP O [[id number]]   brompheniramine-pseudoephedrine-DM 30-2-10 MG/5ML syrup    Sig: Take 5 mLs by mouth 4 (four) times daily as needed.    Dispense:  240 mL    Refill:  0    Supervising  Provider:   BLAISE ALEENE KIDD [id number]   ibuprofen  (ADVIL ) 600 MG tablet    Sig: Take 1 tablet (600 mg total) by mouth every 8 (eight) hours as needed.    Dispense:  30 tablet    Refill:  0    Supervising Provider:   LAMPTEY, PHILIP O [[id number]]     *If you need refills on other medications prior to your next appointment, please contact your pharmacy*  Follow-Up: Call back or seek an in-person evaluation if the symptoms worsen or if the condition fails to improve as anticipated.  [facility] [phone]  Other Instructions INSTRUCTIONS: use a humidifier for nasal congestion Drink plenty of fluids, rest and wash hands frequently to avoid the spread of infection Alternate tylenol and Motrin  for relief of fever    If you have been instructed to have an in-person evaluation today at a local Urgent Care facility, please use the link below. It will take you to a list of all of our available [facility], including address, phone number and hours of operation. Please do not delay care.   Urgent Cares  If you or a family member do not have a primary care provider, use the link below to schedule a visit and establish care. When you choose a  primary care physician or advanced practice provider, you gain a long-term partner in health.  Find a Primary Care Provider  Learn more about [facility]'s in-office and virtual care options: [facility] Get Care Now  "

## 2024-02-03 NOTE — Progress Notes (Signed)
 " Virtual Visit Consent   Jonathan Ramos, you are scheduled for a virtual visit with a [facility] provider today. Just as with appointments in the office, your consent must be obtained to participate. Your consent will be active for this visit and any virtual visit you may have with one of our providers in the next 365 days. If you have a MyChart account, a copy of this consent can be sent to you electronically.  As this is a virtual visit, video technology does not allow for your provider to perform a traditional examination. This may limit your provider's ability to fully assess your condition. If your provider identifies any concerns that need to be evaluated in person or the need to arrange testing (such as labs, EKG, etc.), we will make arrangements to do so. Although advances in technology are sophisticated, we cannot ensure that it will always work on either your end or our end. If the connection with a video visit is poor, the visit may have to be switched to a telephone visit. With either a video or telephone visit, we are not always able to ensure that we have a secure connection.  By engaging in this virtual visit, you consent to the provision of healthcare and authorize for your insurance to be billed (if applicable) for the services provided during this visit. Depending on your insurance coverage, you may receive a charge related to this service.  I need to obtain your verbal consent now. Are you willing to proceed with your visit today? Jonathan Ramos has provided verbal consent on 02/03/2024 for a virtual visit (video or telephone). Jonathan LELON Servant, NP  Date: 02/03/2024 12:40 PM   Virtual Visit via Video Note   I, Jonathan Ramos, connected with  Jonathan Ramos  ([id number], 1995/05/20) on 02/03/24 at 12:30 PM EST by a video-enabled telemedicine application and verified that I am speaking with the correct person using two identifiers.  Location: Patient: Virtual Visit  Location Patient: Home Provider: Virtual Visit Location Provider: Home Office   I discussed the limitations of evaluation and management by telemedicine and the availability of in person appointments. The patient expressed understanding and agreed to proceed.    History of Present Illness: Jonathan Ramos is a 29 y.o. who identifies as a male who was assigned male at birth, and is being seen today for COVID-positive.  [name] tested positive for COVID yesterday.  Over the past 2 days he has been experiencing symptoms of cough, congestion, bilateral ear pain and pressure along with sore throat.  Taking DayQuil at this time with little relief   Problems:  Patient Active Problem List   Diagnosis Date Noted   ADHD (attention deficit hyperactivity disorder) 02/26/2011    Allergies: Allergies[1] Medications: Current Medications[2]  Observations/Objective: Patient is well-developed, well-nourished in no acute distress.  Resting comfortably at home.  Head is normocephalic, atraumatic.  No labored breathing.  Speech is clear and coherent with logical content.  Patient is alert and oriented at baseline.    Assessment and Plan: 1. Positive self-administered antigen test for COVID-19 (Primary) - molnupiravir  EUA (LAGEVRIO ) 200 mg CAPS capsule; Take 4 capsules (800 mg total) by mouth 2 (two) times daily for 5 days.  Dispense: 40 capsule; Refill: 0 - brompheniramine-pseudoephedrine-DM 30-2-10 MG/5ML syrup; Take 5 mLs by mouth 4 (four) times daily as needed.  Dispense: 240 mL; Refill: 0 - ibuprofen  (ADVIL ) 600 MG tablet; Take 1 tablet (600 mg total) by mouth every  8 (eight) hours as needed.  Dispense: 30 tablet; Refill: 0  INSTRUCTIONS: use a humidifier for nasal congestion Drink plenty of fluids, rest and wash hands frequently to avoid the spread of infection Alternate tylenol and Motrin  for relief of fever   Follow Up Instructions: I discussed the assessment and treatment plan with  the patient. The patient was provided an opportunity to ask questions and all were answered. The patient agreed with the plan and demonstrated an understanding of the instructions.  A copy of instructions were sent to the patient via MyChart unless otherwise noted below.    The patient was advised to call back or seek an in-person evaluation if the symptoms worsen or if the condition fails to improve as anticipated.    Jashawna Reever W Ewel Lona, NP     [1]  Allergies Allergen Reactions   Augmentin  [Amoxicillin -Pot Clavulanate] Hives   Promethazine Hcl   [2]  Current Outpatient Medications:    brompheniramine-pseudoephedrine-DM 30-2-10 MG/5ML syrup, Take 5 mLs by mouth 4 (four) times daily as needed., Disp: 240 mL, Rfl: 0   ibuprofen  (ADVIL ) 600 MG tablet, Take 1 tablet (600 mg total) by mouth every 8 (eight) hours as needed., Disp: 30 tablet, Rfl: 0   molnupiravir  EUA (LAGEVRIO ) 200 mg CAPS capsule, Take 4 capsules (800 mg total) by mouth 2 (two) times daily for 5 days., Disp: 40 capsule, Rfl: 0   cetirizine  (ZYRTEC ) 10 MG tablet, Take 1 tablet (10 mg total) by mouth daily., Disp: 30 tablet, Rfl: 0   fluticasone  (FLONASE ) 50 MCG/ACT nasal spray, Place 2 sprays into both nostrils daily., Disp: 16 g, Rfl: 6   minocycline (MINOCIN) 100 MG capsule, Take by mouth. (Patient not taking: Reported on 03/09/2022), Disp: , Rfl:   "
